# Patient Record
Sex: Female | Born: 1995 | Race: Black or African American | Hispanic: No | Marital: Single | Smoking: Never smoker
Health system: Southern US, Community
[De-identification: ages and names within clinical notes are randomized; demographics above are authoritative.]

## PROBLEM LIST (undated history)

## (undated) DIAGNOSIS — J45909 Unspecified asthma, uncomplicated: Secondary | ICD-10-CM

---
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
## 2020-07-15 ENCOUNTER — Emergency Department (HOSPITAL_COMMUNITY)
Admission: EM | Admit: 2020-07-15 | Discharge: 2020-07-15 | Disposition: A | Discharge status: Home / Self Care | Payer: Medicaid Other | Attending: Emergency Medicine | Admitting: Emergency Medicine

## 2020-07-15 ENCOUNTER — Other Ambulatory Visit: Payer: Self-pay

## 2020-07-15 ENCOUNTER — Encounter (HOSPITAL_COMMUNITY): Payer: Self-pay

## 2020-07-15 DIAGNOSIS — R252 Cramp and spasm: Secondary | ICD-10-CM | POA: Insufficient documentation

## 2020-07-15 DIAGNOSIS — A64 Unspecified sexually transmitted disease: Secondary | ICD-10-CM | POA: Insufficient documentation

## 2020-07-15 DIAGNOSIS — N898 Other specified noninflammatory disorders of vagina: Secondary | ICD-10-CM | POA: Diagnosis not present

## 2020-07-15 DIAGNOSIS — Z114 Encounter for screening for human immunodeficiency virus [HIV]: Secondary | ICD-10-CM | POA: Diagnosis not present

## 2020-07-15 DIAGNOSIS — J45909 Unspecified asthma, uncomplicated: Secondary | ICD-10-CM | POA: Insufficient documentation

## 2020-07-15 DIAGNOSIS — N938 Other specified abnormal uterine and vaginal bleeding: Secondary | ICD-10-CM | POA: Diagnosis present

## 2020-07-15 HISTORY — DX: Unspecified asthma, uncomplicated: J45.909

## 2020-07-15 LAB — WET PREP, GENITAL
Sperm: NONE SEEN
Trich, Wet Prep: NONE SEEN
Yeast Wet Prep HPF POC: NONE SEEN

## 2020-07-15 LAB — URINALYSIS, ROUTINE W REFLEX MICROSCOPIC
Bilirubin Urine: NEGATIVE
Glucose, UA: NEGATIVE mg/dL
Ketones, ur: NEGATIVE mg/dL
Nitrite: NEGATIVE
Protein, ur: NEGATIVE mg/dL
Specific Gravity, Urine: 1.01 (ref 1.005–1.030)
pH: 7 (ref 5.0–8.0)

## 2020-07-15 LAB — I-STAT BETA HCG BLOOD, ED (MC, WL, AP ONLY): I-stat hCG, quantitative: <5 m[IU]/mL (ref <5)

## 2020-07-15 LAB — HIV ANTIBODY (ROUTINE TESTING W REFLEX): HIV Screen 4th Generation wRfx: NONREACTIVE

## 2020-07-15 MED ORDER — LIDOCAINE HCL (PF) 1 % IJ SOLN
1.0000 mL | Freq: Once | INTRAMUSCULAR | Status: AC
  Administered 2020-07-15: 1 mL
  Filled 2020-07-15: qty 30

## 2020-07-15 MED ORDER — CEFTRIAXONE SODIUM 500 MG IJ SOLR
500.0000 mg | Freq: Once | INTRAMUSCULAR | Status: AC
  Administered 2020-07-15: 500 mg via INTRAMUSCULAR
  Filled 2020-07-15: qty 500

## 2020-07-15 MED ORDER — AZITHROMYCIN 250 MG PO TABS
1000.0000 mg | ORAL_TABLET | Freq: Once | ORAL | Status: AC
  Administered 2020-07-15: 1000 mg via ORAL
  Filled 2020-07-15: qty 4

## 2020-07-15 MED ORDER — METRONIDAZOLE 500 MG PO TABS
2000.0000 mg | ORAL_TABLET | Freq: Once | ORAL | Status: AC
  Administered 2020-07-15: 2000 mg via ORAL
  Filled 2020-07-15: qty 4

## 2020-07-15 NOTE — ED Notes (Signed)
White/ yellow discharge noted with some slight bleeding on toilet paper.

## 2020-07-15 NOTE — ED Triage Notes (Signed)
Pt reports recently treated for STD and says started having pelvic cramping, vaginal bleeding, and discharge yesterday.  Pt says she's unsure of which std she had.

## 2020-07-15 NOTE — ED Provider Notes (Addendum)
[facility] EMERGENCY DEPARTMENT Provider Note   CSN: [id number] Arrival date & time: 07/15/20  4270     History Chief Complaint  Patient presents with  . S74.5    Judy Moore is a 24 y.o. female.  Patient treated for STD 4 months ago.  Not recently.  For the past 2 weeks she feels as if there is been questionable discharge some cramping and spotting.  Patient is on long-acting birth control.  States her sexual partner does have a discharge.  He is being seen medically for this.        Past Medical History:  Diagnosis Date  . Asthma     There are no problems to display for this patient.   History reviewed. No pertinent surgical history.   OB History   No obstetric history on file.     No family history on file.  Social History   Tobacco Use  . Smoking status: Never Smoker  . Smokeless tobacco: Never Used  Substance Use Topics  . Alcohol use: Never  . Drug use: Never    Home Medications Prior to Admission medications   Not on File    Allergies    Patient has no known allergies.  Review of Systems   Review of Systems  Constitutional: Negative for chills and fever.  HENT: Negative for congestion, rhinorrhea and sore throat.   Eyes: Negative for visual disturbance.  Respiratory: Negative for cough and shortness of breath.   Cardiovascular: Negative for chest pain and leg swelling.  Gastrointestinal: Negative for abdominal pain, diarrhea, nausea and vomiting.  Genitourinary: Positive for vaginal bleeding and vaginal discharge. Negative for dysuria.  Musculoskeletal: Negative for back pain and neck pain.  Skin: Negative for rash.  Neurological: Negative for dizziness, light-headedness and headaches.  Hematological: Does not bruise/bleed easily.  Psychiatric/Behavioral: Negative for confusion.    Physical Exam Updated Vital Signs BP 138/73 (BP Location: Left Arm)   Pulse 71   Temp 98.4 F (36.9 C) (Oral)   Resp 18   Ht 1.651 m (5\' 5" )    Wt 78 kg   SpO2 100%   BMI 28.62 kg/m   Physical Exam Vitals and nursing note reviewed.  Constitutional:      General: She is not in acute distress.    Appearance: Normal appearance. She is well-developed.  HENT:     Head: Normocephalic and atraumatic.  Eyes:     Extraocular Movements: Extraocular movements intact.     Conjunctiva/sclera: Conjunctivae normal.     Pupils: Pupils are equal, round, and reactive to light.  Cardiovascular:     Rate and Rhythm: Normal rate and regular rhythm.     Heart sounds: No murmur heard.   Pulmonary:     Effort: Pulmonary effort is normal. No respiratory distress.     Breath sounds: Normal breath sounds.  Abdominal:     Palpations: Abdomen is soft.     Tenderness: There is no abdominal tenderness.  Genitourinary:    General: Normal vulva.     Vagina: Vaginal discharge present.     Comments: External genitalia without any lesions or sores.  No abnormality.  Vaginal vault clear no blood clots.  But a little bit of a purulent bloody discharge from the cervix.  No cervical motion tenderness.  No uterine tenderness no adnexal tenderness. Musculoskeletal:        General: Normal range of motion.     Cervical back: Normal range of motion and neck supple.  Skin:    General: Skin is warm and dry.  Neurological:     Mental Status: She is alert.     ED Results / Procedures / Treatments   Labs (all labs ordered are listed, but only abnormal results are displayed) Labs Reviewed  URINALYSIS, ROUTINE W REFLEX MICROSCOPIC - Abnormal; Notable for the following components:      Result Value   APPearance HAZY (*)    Hgb urine dipstick LARGE (*)    Leukocytes,Ua MODERATE (*)    Bacteria, UA RARE (*)    All other components within normal limits  WET PREP, GENITAL  RPR  HIV ANTIBODY (ROUTINE TESTING W REFLEX)  I-STAT BETA HCG BLOOD, ED ([facility], [facility], [facility] ONLY)  GC/CHLAMYDIA PROBE AMP ([place]) NOT AT [facility]    EKG None  Radiology No results  found.  Procedures Procedures (including critical care time)  Medications Ordered in ED Medications  azithromycin (ZITHROMAX) tablet 1,000 mg (has no administration in time range)  cefTRIAXone (ROCEPHIN) injection 500 mg (has no administration in time range)  lidocaine (PF) (XYLOCAINE) 1 % injection 1 mL (has no administration in time range)  metroNIDAZOLE (FLAGYL) tablet 2,000 mg (has no administration in time range)    ED Course  I have reviewed the triage vital signs and the nursing notes.  Pertinent labs & imaging results that were available during my care of the patient were reviewed by me and considered in my medical decision making (see chart for details).    MDM Rules/Calculators/A&P                          Exam consistent with cervical discharge suggestive of STD.  Wet prep pending.  Pregnancy test negative.  No signs of PID at this point in time.  Patient be treated with Rocephin Zithromax and Flagyl.   Wet prep shows evidence of clue cells.  But clinically no significant bacterial vaginosis type diagnosis.  Will continue just with the STD treatment.  Final Clinical Impression(s) / ED Diagnoses Final diagnoses:  STD (sexually transmitted disease)    Rx / DC Orders ED Discharge Orders    None       Vanetta Mulders, MD 07/15/20 3220    Vanetta Mulders, MD 07/15/20 [phone]

## 2020-07-15 NOTE — Discharge Instructions (Addendum)
Pregnancy test negative today.  Cervical discharge suggestive of sexually transmitted disease.  Culture sent and are pending.  You have been treated here with Rocephin Zithromax and Flagyl these antibiotic should clear up this infection over the next few days.  Would recommend sustaining from sexual intercourse for a week.  Return for any new or worse symptoms or if there is no improvement.

## 2020-07-16 LAB — GC/CHLAMYDIA PROBE AMP (~~LOC~~) NOT AT ARMC
Chlamydia: POSITIVE — AB
Comment: NEGATIVE
Comment: NORMAL
Neisseria Gonorrhea: POSITIVE — AB

## 2020-07-16 LAB — RPR: RPR Ser Ql: NONREACTIVE

## 2021-03-18 DEATH — deceased
